# Patient Record
Sex: Male | Born: 1980 | Race: White | Hispanic: No | Marital: Married | Smoking: Current every day smoker
Health system: Southern US, Community
[De-identification: ages and names within clinical notes are randomized; demographics above are authoritative.]

---

## 2015-11-08 ENCOUNTER — Emergency Department: Payer: Self-pay

## 2015-11-08 ENCOUNTER — Emergency Department
Admission: EM | Admit: 2015-11-08 | Discharge: 2015-11-08 | Disposition: A | Discharge status: Home / Self Care | Payer: Self-pay | Attending: Emergency Medicine | Admitting: Emergency Medicine

## 2015-11-08 ENCOUNTER — Encounter: Payer: Self-pay | Admitting: Emergency Medicine

## 2015-11-08 DIAGNOSIS — M5431 Sciatica, right side: Secondary | ICD-10-CM

## 2015-11-08 DIAGNOSIS — F17200 Nicotine dependence, unspecified, uncomplicated: Secondary | ICD-10-CM | POA: Insufficient documentation

## 2015-11-08 DIAGNOSIS — M5441 Lumbago with sciatica, right side: Secondary | ICD-10-CM | POA: Insufficient documentation

## 2015-11-08 MED ORDER — DIAZEPAM 5 MG PO TABS
ORAL_TABLET | ORAL | Status: AC
  Filled 2015-11-08: qty 1

## 2015-11-08 MED ORDER — NAPROXEN 500 MG PO TABS: 500.0000 mg | ORAL_TABLET | Freq: Two times a day (BID) | ORAL | Status: AC

## 2015-11-08 MED ORDER — DEXAMETHASONE SODIUM PHOSPHATE 10 MG/ML IJ SOLN
10.0000 mg | Freq: Once | INTRAMUSCULAR | Status: AC
  Administered 2015-11-08: 10 mg via INTRAVENOUS
  Filled 2015-11-08: qty 1

## 2015-11-08 MED ORDER — KETOROLAC TROMETHAMINE 30 MG/ML IJ SOLN
INTRAMUSCULAR | Status: AC
  Filled 2015-11-08: qty 1

## 2015-11-08 MED ORDER — ONDANSETRON HCL 4 MG/2ML IJ SOLN
INTRAMUSCULAR | Status: AC
  Administered 2015-11-08: 4 mg via INTRAVENOUS
  Filled 2015-11-08: qty 2

## 2015-11-08 MED ORDER — DIAZEPAM 5 MG PO TABS: 5.0000 mg | ORAL_TABLET | Freq: Three times a day (TID) | ORAL | Status: AC | PRN

## 2015-11-08 MED ORDER — ONDANSETRON HCL 4 MG/2ML IJ SOLN
4.0000 mg | Freq: Once | INTRAMUSCULAR | Status: AC
  Administered 2015-11-08: 4 mg via INTRAVENOUS

## 2015-11-08 MED ORDER — DIAZEPAM 5 MG/ML IJ SOLN
INTRAMUSCULAR | Status: AC
Start: 2015-11-08 — End: 2015-11-08
  Administered 2015-11-08: 5 mg via INTRAVENOUS
  Filled 2015-11-08: qty 2

## 2015-11-08 MED ORDER — MORPHINE SULFATE (PF) 4 MG/ML IV SOLN
INTRAVENOUS | Status: AC
  Filled 2015-11-08: qty 1

## 2015-11-08 MED ORDER — MORPHINE SULFATE (PF) 4 MG/ML IV SOLN: 4.0000 mg | Freq: Once | INTRAVENOUS | Status: DC

## 2015-11-08 MED ORDER — DEXAMETHASONE SODIUM PHOSPHATE 10 MG/ML IJ SOLN
INTRAMUSCULAR | Status: AC
  Filled 2015-11-08: qty 1

## 2015-11-08 MED ORDER — SODIUM CHLORIDE 0.9 % IV SOLN
1000.0000 mL | Freq: Once | INTRAVENOUS | Status: AC
  Administered 2015-11-08: 1000 mL via INTRAVENOUS

## 2015-11-08 MED ORDER — KETOROLAC TROMETHAMINE 30 MG/ML IJ SOLN
30.0000 mg | Freq: Once | INTRAMUSCULAR | Status: AC
  Administered 2015-11-08: 30 mg via INTRAVENOUS

## 2015-11-08 MED ORDER — DIAZEPAM 5 MG/ML IJ SOLN
5.0000 mg | Freq: Once | INTRAMUSCULAR | Status: AC
  Administered 2015-11-08: 5 mg via INTRAVENOUS

## 2015-11-08 NOTE — Discharge Instructions (Signed)

## 2015-11-08 NOTE — ED Provider Notes (Addendum)
[facility] Emergency Department Provider Note   ____________________________________________    I have reviewed the triage vital signs and the nursing notes.   HISTORY  Chief Complaint Back Pain     HPI Jeffrey White is a 35 y.o. male who presents with complaints of lower back pain, he reports the pain is primarily on the right. This started this morning when he was helping a friend lift a heavy object. He feels pain radiating into his right thigh. No weakness. No loss of sensation in his groin. No difficulty with urination. He has had similar pain in the past and thinks he reinjured himself. Pain is primarily on the right and is moderate in intensity and worse with bending and flexion. No IV drug abuse.   History reviewed. No pertinent past medical history.  There are no active problems to display for this patient.   History reviewed. No pertinent past surgical history.  No current outpatient prescriptions on file.  Allergies Review of patient's allergies indicates no known allergies.  No family history on file.  Social History Social History  Substance Use Topics  . Smoking status: Current Every Day Smoker  . Smokeless tobacco: None  . Alcohol Use: None    Review of Systems  Constitutional: No fever/chills  ENT: No Neck pain Cardiovascular: Denies chest pain.  Gastrointestinal: No abdominal pain.  No nausea, no vomiting.   Genitourinary: Negative for incontinence Musculoskeletal: As above Skin: Negative for rash. Neurological: Negative for headaches or focal lower extremity weakness  10-point ROS otherwise negative.  ____________________________________________   PHYSICAL EXAM:  VITAL SIGNS: ED Triage Vitals  Enc Vitals Group     BP 11/08/15 1342 134/92 mmHg     Pulse --      Resp 11/08/15 1342 18     Temp 11/08/15 1342 98.4 F (36.9 C)     Temp Source 11/08/15 1342 Oral     SpO2 11/08/15 1342 96 %     Weight  11/08/15 1342 150 lb (68.04 kg)     Height 11/08/15 1342 5\' 10"  (1.778 m)     Head Cir --      Peak Flow --      Pain Score 11/08/15 1344 9     Pain Loc --      Pain Edu? --      Excl. in GC? --     Constitutional: Alert and oriented. Uncomfortable. Pleasant and interactive Eyes: Conjunctivae are normal.  Head: Atraumatic.Normocephalic Nose: No congestion/rhinnorhea. Mouth/Throat: Mucous membranes are moist.  Neck: Painless ROM Cardiovascular: Normal rate, regular rhythm. Good peripheral circulation Respiratory: Normal respiratory effort.  Gastrointestinal: Soft and nontender. No distention.   Genitourinary: deferred Musculoskeletal: No lower extremity tenderness nor edema.  Warm and well perfused Back: Mild tenderness to palpation right lumbar paraspinal region with possible muscle spasm. No vertebral tenderness to palpation. No flank tenderness to palpation. Worsening pain with flexion and rotation at the waist. Normal strength in the lower extremities Neurologic:  Normal speech and language. No gross focal neurologic deficits are appreciated.  Skin:  Skin is warm, dry and intact. No rash noted. Psychiatric: Mood and affect are normal. Speech and behavior are normal.  ____________________________________________   LABS (all labs ordered are listed, but only abnormal results are displayed)  Labs Reviewed - No data to display ____________________________________________  EKG  ED ECG REPORT I, Jene EveryKINNER, [place]ia Asquith, the attending physician, personally viewed and interpreted this ECG.  Date: 11/08/2015 EKG Time: 1:48 PM Rate:  120 Rhythm: Sinus tachycardia QRS Axis: normal Intervals: normal ST/T Wave abnormalities: normal Conduction Disturbances: none   ____________________________________________  RADIOLOGY  Lumbar x-ray no acute distress ____________________________________________   PROCEDURES  Procedure(s) performed: No    Critical Care performed:  No ____________________________________________   INITIAL IMPRESSION / ASSESSMENT AND PLAN / ED COURSE  Pertinent labs & imaging results that were available during my care of the patient were reviewed by me and considered in my medical decision making (see chart for details).  Patient presents with back pain status post lifting heavy object. Suspect muscle skeletal pain he is quite uncomfortable so we will give IV Toradol, IV Decadron obtain x-rays and give fluids. Suspect tachycardia related to pain on my exam his heart rate is normalized  IV valium added  Xrays normal  Patient reports mild improvement in sx. He is ambulating well. No concerning findings on hx or exam. D/c with naproxen and valium and pcp f/u. Return precautions discussed such as fever, weakness, worsening pain ____________________________________________   FINAL CLINICAL IMPRESSION(S) / ED DIAGNOSES  Final diagnoses:  Sciatica of right side      NEW MEDICATIONS STARTED DURING THIS VISIT:  New Prescriptions   No medications on file     Note:  This document was prepared using Dragon voice recognition software and may include unintentional dictation errors.    Jene Everyobert Jaye Saal, MD 11/08/15 1715  Jene Everyobert Jorian [place]ho[place], MD 11/08/15 [phone]

## 2015-11-08 NOTE — ED Notes (Signed)
Reports lower back pain.  States he thinks he "hurt and old injury"  Skin w/d with good color.

## 2017-07-20 IMAGING — CR DG LUMBAR SPINE 2-3V
1 series · 3 of 3 positions shown · non-contrast
Comparison: None.

CLINICAL DATA: Lifting injury today, pain down right leg and foot.

EXAM:
LUMBAR SPINE - 2-3 VIEW

[Series 1: dg lumbar spine 2-3 views · 0.14mm/px · 3 of 3 slices shown]
[im 1/3]
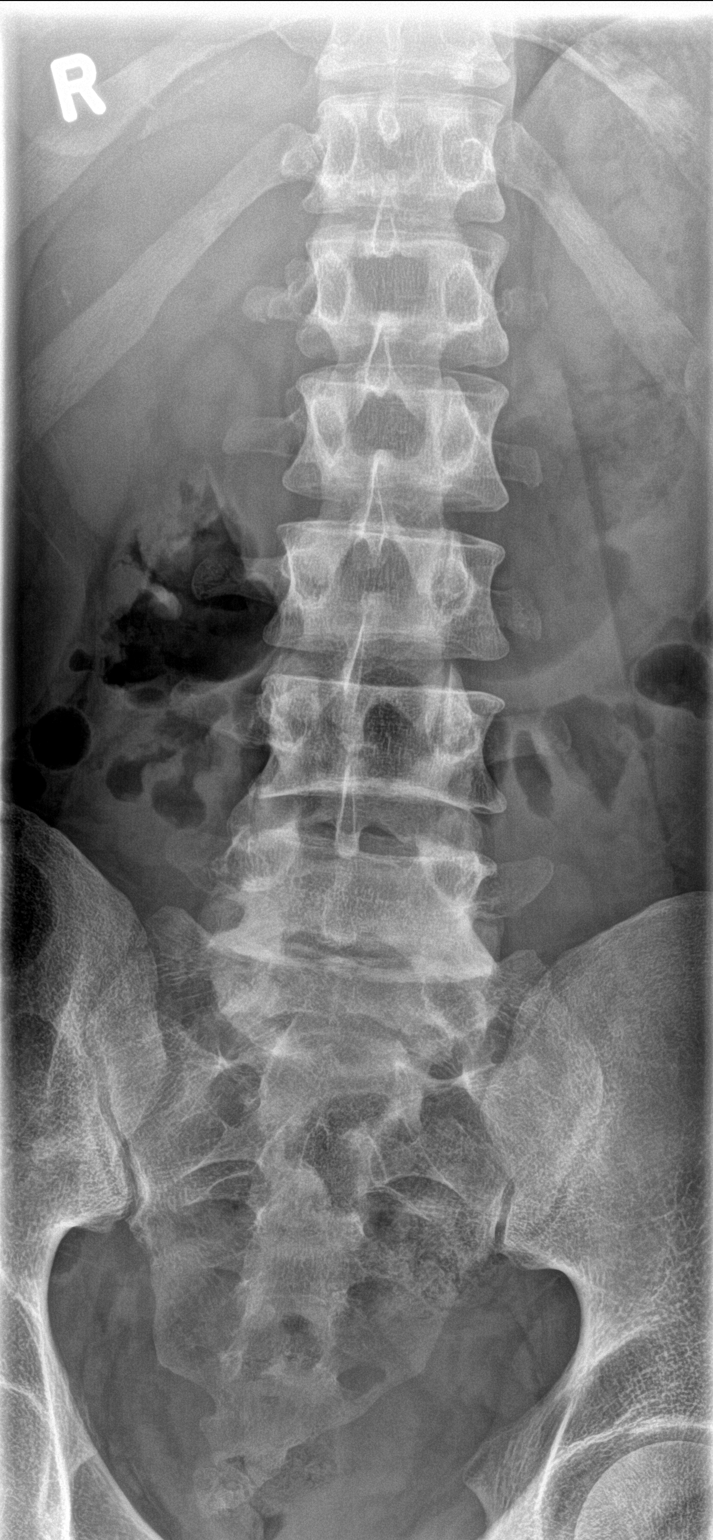
[im 2/3]
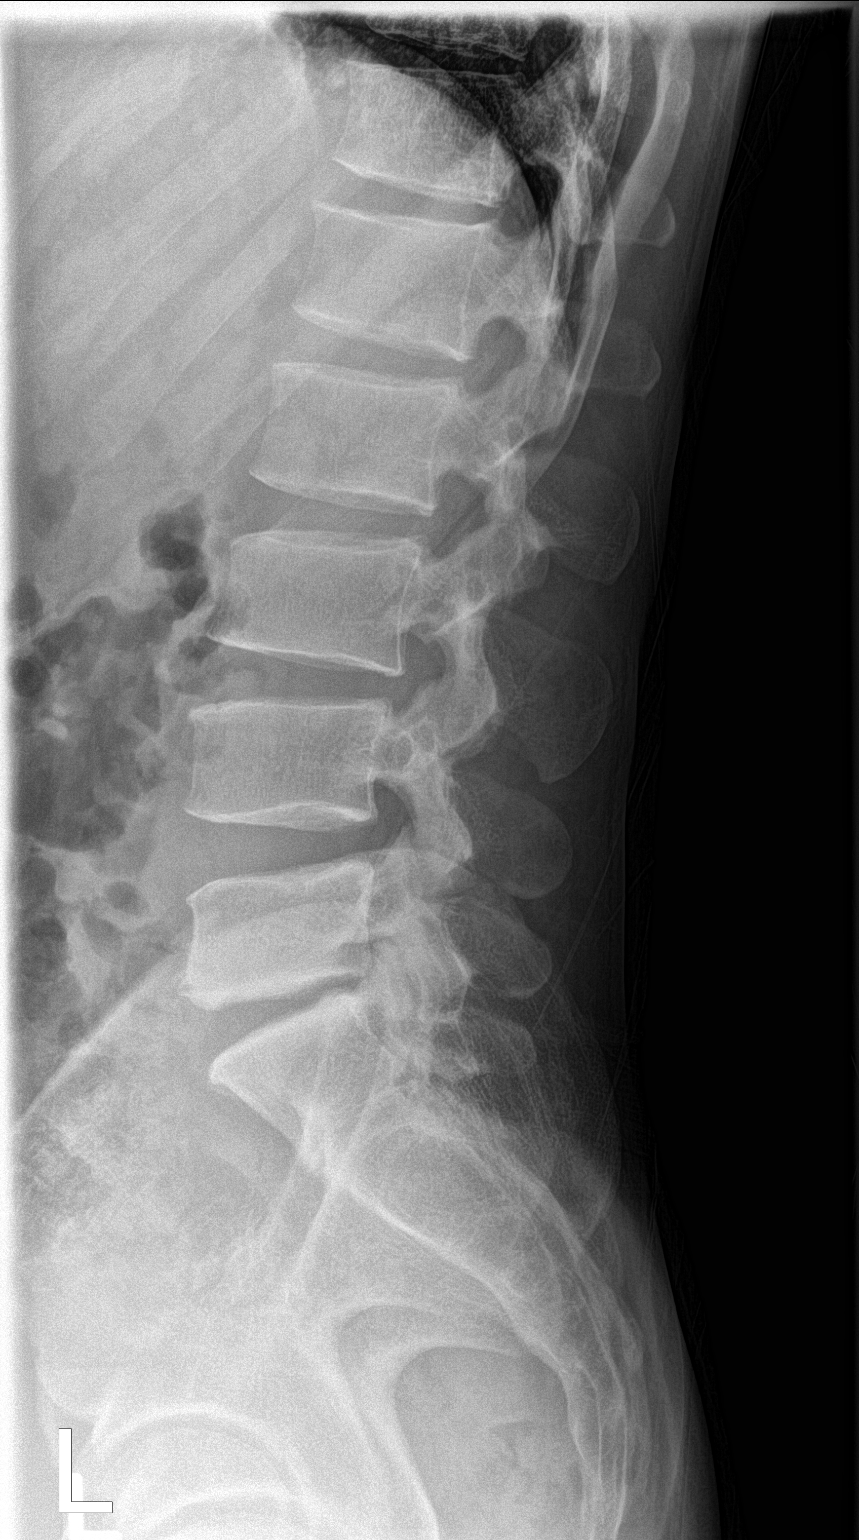
[im 3/3]
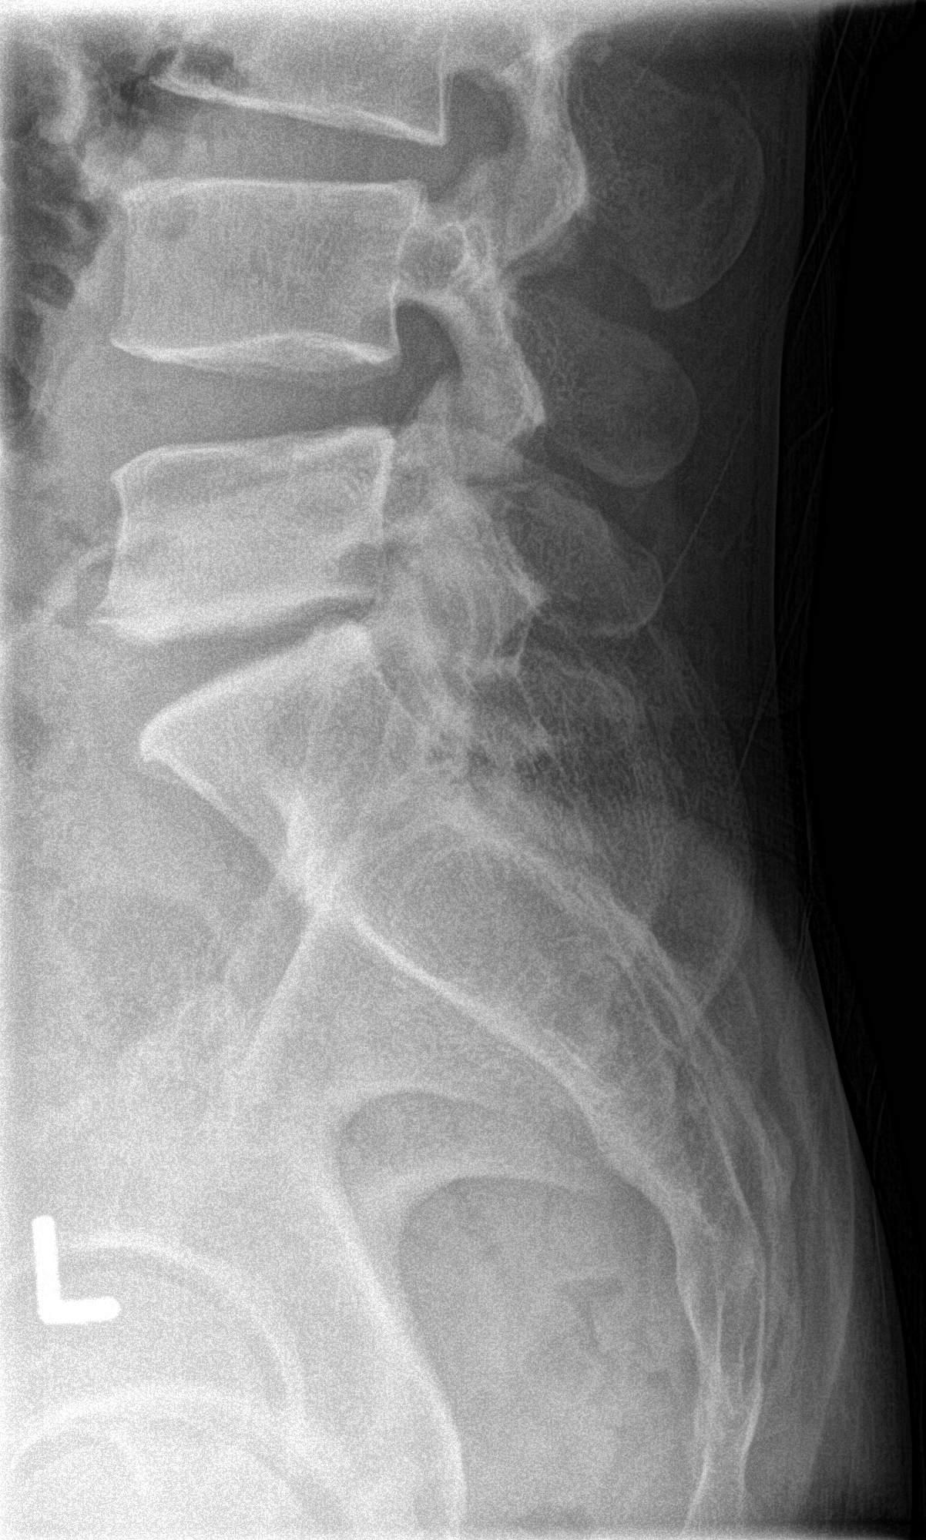

[3 of 3 positions shown; findings below may reference images not displayed]

FINDINGS: Osseous alignment is normal. No fracture line or displaced fracture
fragment seen. Bone mineralization is normal. There is minimal
wedging of the L3 through L5 vertebral bodies, chronic in
appearance. No evidence of acute compression fracture. Posterior
elements appear normally aligned.

Upper sacrum appears intact and normally aligned. Paravertebral soft
tissues are unremarkable.
IMPRESSION: No acute findings.
